# Patient Record
Sex: Female | Born: 1995 | Race: White | Hispanic: No | Marital: Single | Smoking: Never smoker
Health system: Southern US, Community
[De-identification: ages and names within clinical notes are randomized; demographics above are authoritative.]

## PROBLEM LIST (undated history)

## (undated) HISTORY — PX: WISDOM TOOTH EXTRACTION: SHX21

---

## 2017-01-06 ENCOUNTER — Ambulatory Visit (INDEPENDENT_AMBULATORY_CARE_PROVIDER_SITE_OTHER): Payer: Medicaid Other | Admitting: Sports Medicine

## 2017-01-06 VITALS — BP 102/62 | Ht 64.0 in | Wt 103.0 lb

## 2017-01-06 DIAGNOSIS — M25311 Other instability, right shoulder: Secondary | ICD-10-CM | POA: Diagnosis not present

## 2017-01-06 DIAGNOSIS — M25511 Pain in right shoulder: Secondary | ICD-10-CM | POA: Diagnosis not present

## 2017-01-06 NOTE — Progress Notes (Signed)
   Subjective:    Patient ID: Isabel Reed, female    DOB: Jul 15, 1995, 21 y.o.   MRN: [id number]  HPI chief complaint: Right shoulder pain  21 year old left-hand-dominant female comes in today complaining of left shoulder pain that began over 2 months ago. Pain began acutely after [employer]. She does not recall any specific trauma. She describes an achy discomfort diffuse around the shoulder which is present primarily with any type of shoulder motion, especially overhead motion. She also gets associated numbness and tingling down into the arm and hand. She has a history of shoulder instability. She was treated 3 years ago with physical therapy and had excellent results. 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 She notes that she has multiple instances of shoulder subluxations and dislocations. She is now to point to where her shoulder will dislocate even while sleeping. She feels like it is dislocating posteriorly. She does have some associated weakness. Recent x-rays done in July were unremarkable per her report. She did have an MRI done of her right shoulder 3 years ago but it was not an MR arthrogram. She has not had any prior surgeries on her shoulder. She denies neck pain. She takes intermittent doses of Advil which helps some.  Past medical history reviewed Medications reviewed Allergies reviewed    Review of Systems    as above Objective:   Physical Exam  Well-developed, well-nourished. No acute distress. Awake alert and oriented 3. Vital signs reviewed  Right shoulder: Patient has full forward flexion actively and passively. She has limited abduction to 90 both actively and passively. Good internal rotation. Good external rotation. No tenderness to palpation over the clavicle or over the acromioclavicular joint. Positive sulcus sign. 2+ translation of the humeral head with passive anterior to  posterior movement. Positive apprehension. Positive O'Brien's. Rotator cuff strength is 5/5.  Neurological exam: Negative Spurling's. Strength is somewhat limited by pain. Reflexes are brisk and equal at the biceps, triceps, and brachial radialis tendons bilaterally. No atrophy.      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 It is now to the point to where she is having multiple episodes of subluxation and dislocations with activities of daily living. She is likely at the point of needing a capsulorrhaphy. She may also have a labral tear. Therefore, for presurgical planning, we will get an MRI arthrogram. Phone follow-up with those results when available. In the meantime she will continue with her Motrin as needed. I want her to continue with subscapularis strengthening but avoid all overhead activities.

## 2017-01-21 ENCOUNTER — Ambulatory Visit
Admission: RE | Admit: 2017-01-21 | Discharge: 2017-01-21 | Disposition: A | Discharge status: Home / Self Care | Payer: Medicaid Other | Source: Ambulatory Visit | Attending: Sports Medicine | Admitting: Sports Medicine

## 2017-01-21 ENCOUNTER — Encounter: Payer: Self-pay | Admitting: Radiology

## 2017-01-21 DIAGNOSIS — M25511 Pain in right shoulder: Secondary | ICD-10-CM

## 2017-01-21 MED ORDER — IOPAMIDOL (ISOVUE-M 200) INJECTION 41%
15.0000 mL | Freq: Once | INTRAMUSCULAR | Status: AC
  Administered 2017-01-21: 15 mL via INTRA_ARTICULAR

## 2017-01-25 ENCOUNTER — Telehealth: Payer: Self-pay | Admitting: Sports Medicine

## 2017-01-25 NOTE — Telephone Encounter (Signed)
I spoke with the patient on the phone today after reviewing her right shoulder MRI arthrogram. There is no evidence of labral tear or rotator cuff tear. This is reassuring but I think her main issue is chronic instability which continues to affect her activities of daily living. I think it would be beneficial for her to meet with an orthopedist to discuss a possible capsulorrhaphy. She is in college at [place] and would like to discuss this with her mother. She may prefer to meet with an orthopedist closer to home. She will call me if she needs me to make a referral for her. Otherwise, follow-up as needed.

## 2017-01-27 ENCOUNTER — Other Ambulatory Visit: Payer: Self-pay

## 2017-01-27 DIAGNOSIS — M25311 Other instability, right shoulder: Secondary | ICD-10-CM

## 2017-08-31 ENCOUNTER — Emergency Department: Payer: Self-pay

## 2017-08-31 ENCOUNTER — Emergency Department
Admission: EM | Admit: 2017-08-31 | Discharge: 2017-08-31 | Disposition: A | Discharge status: Home / Self Care | Payer: Self-pay | Attending: Student in an Organized Health Care Education/Training Program | Admitting: Student in an Organized Health Care Education/Training Program

## 2017-08-31 DIAGNOSIS — S60222A Contusion of left hand, initial encounter: Secondary | ICD-10-CM | POA: Insufficient documentation

## 2017-08-31 DIAGNOSIS — Z79899 Other long term (current) drug therapy: Secondary | ICD-10-CM | POA: Insufficient documentation

## 2017-08-31 DIAGNOSIS — Y92002 Bathroom of unspecified non-institutional (private) residence single-family (private) house as the place of occurrence of the external cause: Secondary | ICD-10-CM | POA: Insufficient documentation

## 2017-08-31 DIAGNOSIS — Y999 Unspecified external cause status: Secondary | ICD-10-CM | POA: Insufficient documentation

## 2017-08-31 DIAGNOSIS — Y939 Activity, unspecified: Secondary | ICD-10-CM | POA: Insufficient documentation

## 2017-08-31 DIAGNOSIS — W2209XA Striking against other stationary object, initial encounter: Secondary | ICD-10-CM | POA: Insufficient documentation

## 2017-08-31 MED ORDER — MELOXICAM 7.5 MG PO TABS: 7.5000 mg | ORAL_TABLET | Freq: Every day | ORAL | 1 refills | Status: AC

## 2017-08-31 NOTE — ED Notes (Signed)
Pt discharged to home.  Friend driving.  Discharge instructions reviewed.  Verbalized understanding.  No questions or concerns at this time.  Teach back verified.  Pt in NAD.  No items left in ED.   

## 2017-08-31 NOTE — ED Provider Notes (Signed)
[facility] Emergency Department Provider Note  ____________________________________________  Time seen: Approximately 9:20 PM  I have reviewed the triage vital signs and the nursing notes.   HISTORY  Chief Complaint Hand Pain    HPI Isabel Reed is a 22 y.o. female presents to the emergency department with left hand pain after she struck a wall.  Patient has  most of her pain along the MCP joint of the left third digit.  Patient reports that she intentionally struck the wall of her shower.  She has experienced left hand avoidance.  She denies radiculopathy or changes in sensation of the hand.  No skin compromise. She currently rates her pain at 10/10 in intensity and describes it as aching.   History reviewed. No pertinent past medical history.  There are no active problems to display for this patient.   Past Surgical History:  Procedure Laterality Date  . WISDOM TOOTH EXTRACTION      Prior to Admission medications   Medication Sig Start Date End Date Taking? Authorizing Provider  amphetamine-dextroamphetamine (ADDERALL) 20 MG tablet Take 20 mg by mouth daily.    [provider]  meloxicam (MOBIC) 7.5 MG tablet Take 1 tablet (7.5 mg total) by mouth daily for 7 days. 08/31/17 09/07/17  Orvil Feil, PA-C    Allergies Penicillins  No family history on file.  Social History Social History   Tobacco Use  . Smoking status: Never Smoker  . Smokeless tobacco: Never Used  Substance Use Topics  . Alcohol use: Yes  . Drug use: Not on file     Review of Systems  Constitutional: No fever/chills Eyes: No visual changes. No discharge ENT: No upper respiratory complaints. Cardiovascular: no chest pain. Respiratory: no cough. No SOB. Gastrointestinal: No abdominal pain.  No nausea, no vomiting.  No diarrhea.  No constipation. Musculoskeletal: Patient has left hand pain.  Skin: Negative for rash, abrasions, lacerations,  ecchymosis. Neurological: Negative for headaches, focal weakness or numbness.  ____________________________________________   PHYSICAL EXAM:  VITAL SIGNS: ED Triage Vitals  Enc Vitals Group     BP 08/31/17 2036 (!) 137/97     Pulse Rate 08/31/17 2036 (!) 111     Resp 08/31/17 2036 18     Temp 08/31/17 2036 99.8 F (37.7 C)     Temp Source 08/31/17 2036 Oral     SpO2 08/31/17 2036 100 %     Weight 08/31/17 2038 111 lb (50.3 kg)     Height --      Head Circumference --      Peak Flow --      Pain Score 08/31/17 2038 8     Pain Loc --      Pain Edu? --      Excl. in GC? --      Constitutional: Alert and oriented. Well appearing and in no acute distress. Eyes: Conjunctivae are normal. PERRL. EOMI. Head: Atraumatic. Cardiovascular: Normal rate, regular rhythm. Normal S1 and S2.  Good peripheral circulation. Respiratory: Normal respiratory effort without tachypnea or retractions. Lungs CTAB. Good air entry to the bases with no decreased or absent breath sounds. Musculoskeletal: Patient is able to perform full range of motion at the left wrist.  She is able to move all 5 left fingers.  She has focal ecchymosis over the MCP joint of the left third digit.  Palpable radial pulse, left.  No flexor or extensor tendon deficits appreciated, left Neurologic:  Normal speech and language. No gross focal  neurologic deficits are appreciated.  Skin:  Skin is warm, dry and intact. No rash noted. Psychiatric: Mood and affect are normal. Speech and behavior are normal. Patient exhibits appropriate insight and judgement.   ____________________________________________   LABS (all labs ordered are listed, but only abnormal results are displayed)  Labs Reviewed - No data to display ____________________________________________  EKG   ____________________________________________  RADIOLOGY Geraldo Pitter, personally viewed and evaluated these images (plain radiographs) as part of my  medical decision making, as well as reviewing the written report by the radiologist.  Dg Hand Complete Left  Result Date: 08/31/2017 CLINICAL DATA:  Hand pain after punching a wall. EXAM: LEFT HAND - COMPLETE 3+ VIEW COMPARISON:  None. FINDINGS: There is no evidence of fracture or dislocation. There is no evidence of arthropathy or other focal bone abnormality. Mild dorsal hand soft tissue swelling without subcutaneous gas or radiopaque foreign bodies. IMPRESSION: Soft tissue swelling without acute osseous process. Electronically Signed   By: Awilda Metro M.D.   On: 08/31/2017 21:14    ____________________________________________    PROCEDURES  Procedure(s) performed:    Procedures    Medications - No data to display   ____________________________________________   INITIAL IMPRESSION / ASSESSMENT AND PLAN / ED COURSE  Pertinent labs & imaging results that were available during my care of the patient were reviewed by me and considered in my medical decision making (see chart for details).  Review of the [place] CSRS was performed in accordance of the [facility] prior to dispensing any controlled drugs.    Assessment and Plan: Hand Contusion:  Patient presents to the emergency department with left third digit pain after striking a wall.  Differential diagnosis included fracture versus contusion.  No acute fractures were identified on x-ray examination of the hand.  Patient was discharged with meloxicam and advised to follow-up with Dr. Stephenie Acres as needed.  All patient questions were answered.    ____________________________________________  FINAL CLINICAL IMPRESSION(S) / ED DIAGNOSES  Final diagnoses:  Contusion of left hand, initial encounter      NEW MEDICATIONS STARTED DURING THIS VISIT:  ED Discharge Orders        Ordered    meloxicam (MOBIC) 7.5 MG tablet  Daily     08/31/17 2124          This chart was dictated using voice recognition software/Dragon. Despite  best efforts to proofread, errors can occur which can change the meaning. Any change was purely unintentional.    Gasper Lloyd 08/31/17 2128    Willy Eddy, MD 08/31/17 [phone]

## 2017-08-31 NOTE — ED Triage Notes (Signed)
Patient punch a wall with a left hand. Patient c/o left hand pain

## 2018-05-07 ENCOUNTER — Other Ambulatory Visit: Payer: Self-pay

## 2018-05-07 ENCOUNTER — Emergency Department
Admission: EM | Admit: 2018-05-07 | Discharge: 2018-05-07 | Disposition: A | Discharge status: Home / Self Care | Payer: Medicaid Other | Attending: Emergency Medicine | Admitting: Emergency Medicine

## 2018-05-07 DIAGNOSIS — F10920 Alcohol use, unspecified with intoxication, uncomplicated: Secondary | ICD-10-CM

## 2018-05-07 DIAGNOSIS — Z79899 Other long term (current) drug therapy: Secondary | ICD-10-CM | POA: Insufficient documentation

## 2018-05-07 DIAGNOSIS — F10929 Alcohol use, unspecified with intoxication, unspecified: Secondary | ICD-10-CM | POA: Diagnosis present

## 2018-05-07 LAB — I-STAT BETA HCG BLOOD, ED (NOT ORDERABLE): I-stat hCG, quantitative: <5 m[IU]/mL (ref <5)

## 2018-05-07 MED ORDER — ONDANSETRON 4 MG PO TBDP
4.0000 mg | ORAL_TABLET | Freq: Once | ORAL | Status: AC
  Administered 2018-05-07: 4 mg via ORAL
  Filled 2018-05-07: qty 1

## 2018-05-07 NOTE — ED Notes (Signed)
ISTAT beta hcg preformed. Result was negative. MD Derrill Kay aware.

## 2018-05-07 NOTE — ED Notes (Signed)
Per MD no labs at this time, pregnancy test only.

## 2018-05-07 NOTE — ED Notes (Signed)
Gave pt cup of water with permission from MD

## 2018-05-07 NOTE — ED Provider Notes (Signed)
[facility] Emergency Department Provider Note   ____________________________________________   I have reviewed the triage vital signs and the nursing notes.   HISTORY  Chief Complaint Alcohol Intoxication   History limited by and level 5 caveat due to: intoxication   HPI Isabel Reed is a 23 y.o. female who presents to the emergency department today because of concern for alcohol intoxication. Friends state that she was drinking tequila and alcohol today. The patient herself was able to deny that she used any other recreational drugs. States she took her normal medications this morning. Denies any falls today. Denies any headache. States that she does feel nauseous.   Per medical record review patient has a history of wisdom tooth extraction.  History reviewed. No pertinent past medical history.  There are no active problems to display for this patient.   Past Surgical History:  Procedure Laterality Date  . WISDOM TOOTH EXTRACTION      Prior to Admission medications   Medication Sig Start Date End Date Taking? Authorizing Provider  amphetamine-dextroamphetamine (ADDERALL) 20 MG tablet Take 20 mg by mouth daily.    [provider]    Allergies Penicillins  History reviewed. No pertinent family history.  Social History Social History   Tobacco Use  . Smoking status: Never Smoker  . Smokeless tobacco: Never Used  Substance Use Topics  . Alcohol use: Yes  . Drug use: Not on file    Review of Systems Constitutional: No fever/chills Cardiovascular: Denies chest pain. Respiratory: Denies shortness of breath. Gastrointestinal: Positive for nausea.  Genitourinary: Negative for dysuria. Musculoskeletal: Negative for back pain. Skin: Negative for rash. Neurological: Negative for headaches, focal weakness or numbness.  ____________________________________________   PHYSICAL EXAM:  VITAL SIGNS: ED Triage Vitals  Enc Vitals  Group     BP 05/07/18 1826 113/79     Pulse Rate 05/07/18 1826 77     Resp 05/07/18 1826 10     Temp 05/07/18 1826 (!) 97.4 F (36.3 C)     Temp Source 05/07/18 1826 Oral     SpO2 05/07/18 1826 98 %     Weight 05/07/18 1824 105 lb 6.1 oz (47.8 kg)     Height --      Head Circumference --      Peak Flow --      Pain Score 05/07/18 1824 0   Constitutional: Appears intoxicated. Somnolent but will wake up and answer questions.  Eyes: Conjunctivae are normal.  ENT      Head: Normocephalic and atraumatic.      Nose: No congestion/rhinnorhea.      Mouth/Throat: Mucous membranes are moist.      Neck: No stridor. Hematological/Lymphatic/Immunilogical: No cervical lymphadenopathy. Cardiovascular: Normal rate, regular rhythm.  No murmurs, rubs, or gallops.  Respiratory: Normal respiratory effort without tachypnea nor retractions. Breath sounds are clear and equal bilaterally. No wheezes/rales/rhonchi. Gastrointestinal: Soft and non tender. No rebound. No guarding.  Genitourinary: Deferred Musculoskeletal: Normal range of motion in all extremities. No lower extremity edema. Neurologic:  Intoxicated.  Skin:  Skin is warm, dry and intact. No rash noted.  ____________________________________________    LABS (pertinent positives/negatives)  Beta hcg <5  ____________________________________________   EKG  None  ____________________________________________    RADIOLOGY  None  ____________________________________________   PROCEDURES  Procedures  ____________________________________________   INITIAL IMPRESSION / ASSESSMENT AND PLAN / ED COURSE  Pertinent labs & imaging results that were available during my care of the patient were reviewed by me  and considered in my medical decision making (see chart for details).   Patient presented to the emergency department today because of concern for alcohol intoxication. Patient was able to state that she had been drinking  alcohol, denies any other drug use. Denies any falls or trauma today. Patient was observed in the emergency department and did clinically improve. Did discuss appropriate alcohol consumption with patient.   ____________________________________________   FINAL CLINICAL IMPRESSION(S) / ED DIAGNOSES  Final diagnoses:  Alcoholic intoxication without complication (HCC)     Note: This dictation was prepared with Dragon dictation. Any transcriptional errors that result from this process are unintentional     Phineas Semen, MD 05/08/18 [phone]

## 2018-05-07 NOTE — ED Notes (Signed)
Pt verbalized understanding of discharge instructions. NAD at this time. 

## 2018-05-07 NOTE — ED Notes (Addendum)
Pt has had no additional emesis occurrences. Pt states she is ok to go home and friends and present to take the patient home.

## 2018-05-07 NOTE — ED Notes (Signed)
Pt unable to tolerate liquids after med admin.

## 2018-05-07 NOTE — ED Triage Notes (Addendum)
Pt arrives to ED extremely intoxicated. Friends who drove her here state she hasn't eaten since 10am this morning. Was drinking champagne and tequila. Pt was vomiting PTA. Awake but unable to hold self up. Had to be picked up from car by staff members. Slurred speech noted. Crying.

## 2019-02-18 IMAGING — MR MR SHOULDER*R* W/CM
6 series · 40 of 40 positions shown · IV contrast (agent unspecified)
Comparison: Injection images dated 01/21/2017

CLINICAL DATA: Right shoulder pain. History of previous
dislocations.

EXAM:
MR ARTHROGRAM OF THE RIGHT SHOULDER
TECHNIQUE: Multiplanar, multisequence MR imaging of the right shoulder was
performed following the administration of intra-articular contrast.
CONTRAST:  See Injection Documentation.

[Series 3: T1 fat-sat · axial · 4.0mm · 0.25mm/px · z∈[-37,+53]mm · 6 of 20 slices shown (1 of 4)]
[im 1/20]
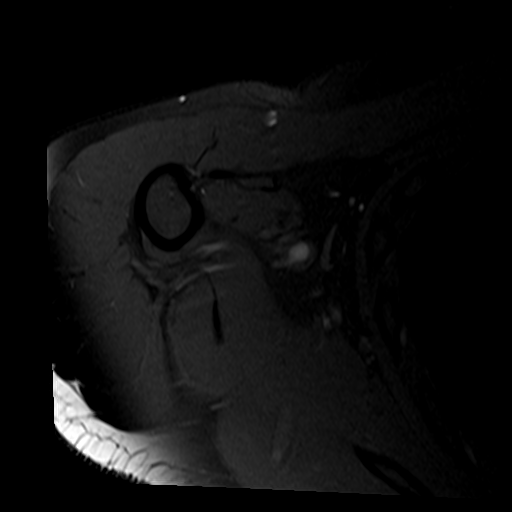
[im 4/20]
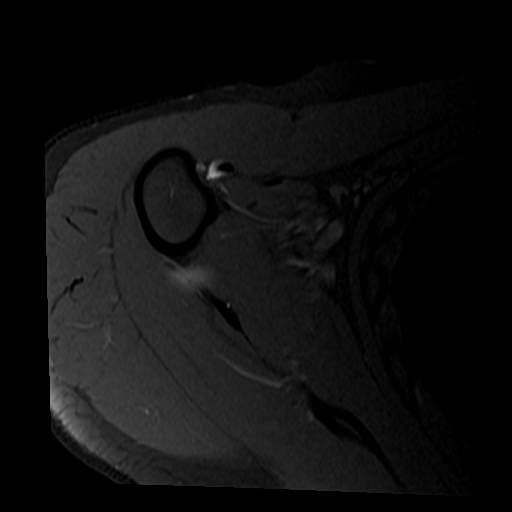
[im 8/20]
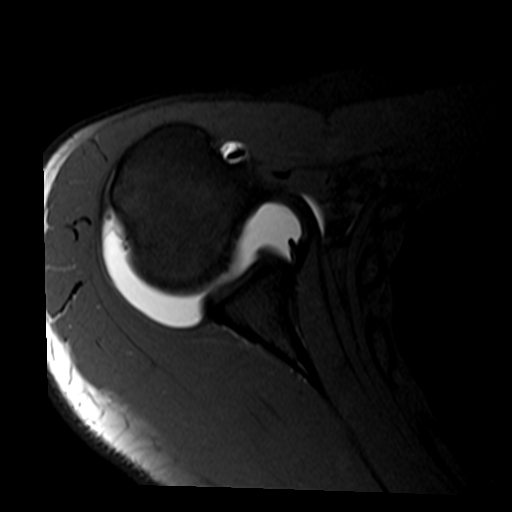
[im 12/20]
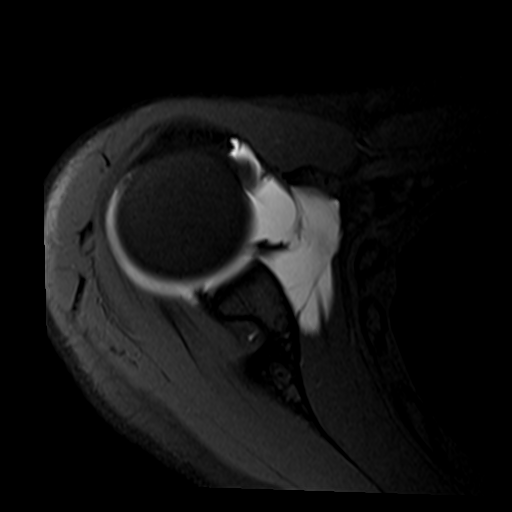
[im 16/20]
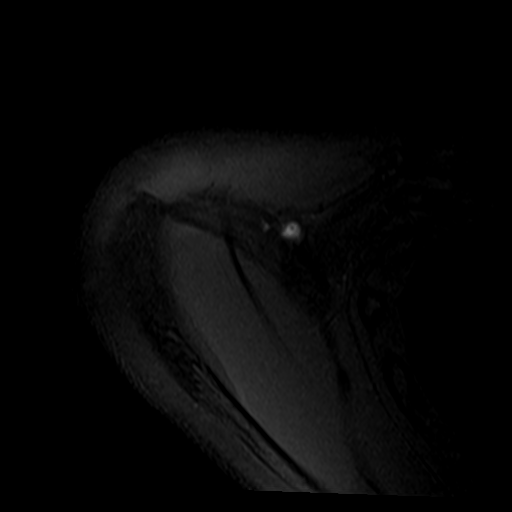
[im 20/20]
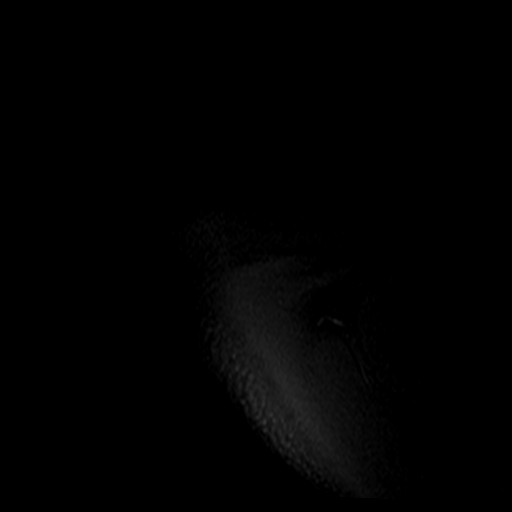

[Series 4: T2 fat-sat · oblique · 4.0mm · 0.55mm/px · 7 of 18 slices shown (1 of 2)]
[im 1/18]
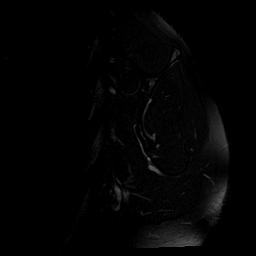
[im 3/18]
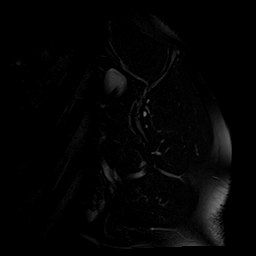
[im 6/18]
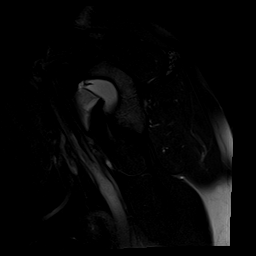
[im 9/18]
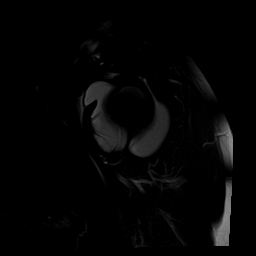
[im 12/18]
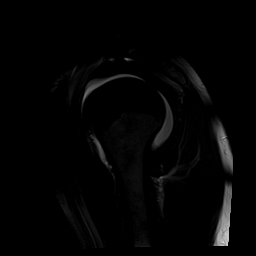
[im 15/18]
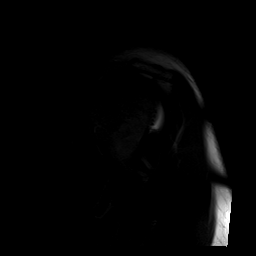
[im 18/18]
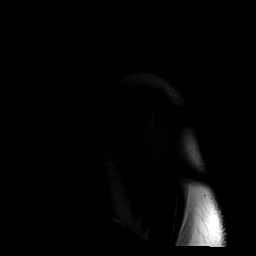

[Series 5: T1 fat-sat · oblique · 4.0mm · 0.44mm/px · 7 of 18 slices shown (2 of 4)]
[im 1/18]
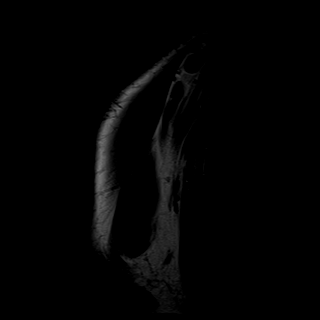
[im 3/18]
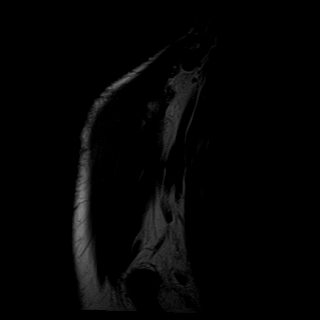
[im 6/18]
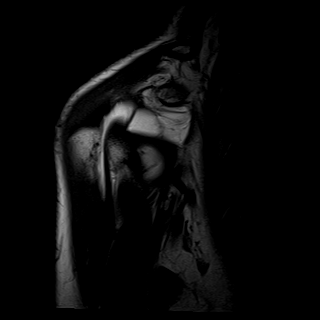
[im 9/18]
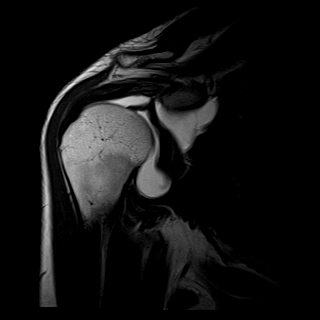
[im 12/18]
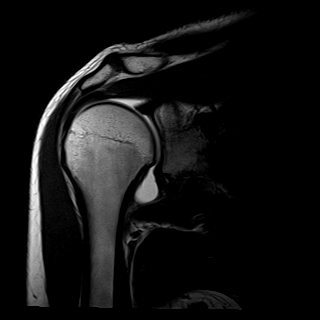
[im 15/18]
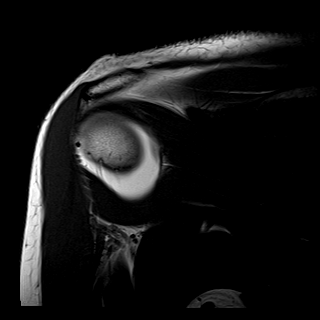
[im 18/18]
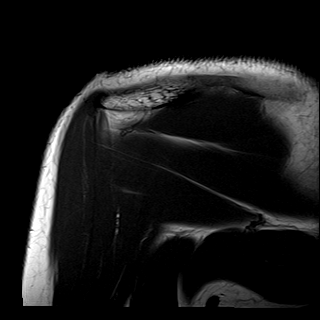

[Series 6: T1 fat-sat · oblique · 4.0mm · 0.55mm/px · 7 of 18 slices shown (3 of 4)]
[im 1/18]
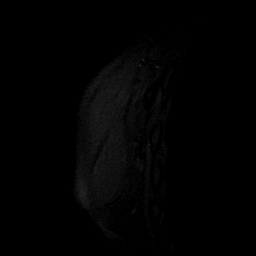
[im 3/18]
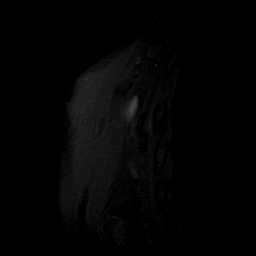
[im 6/18]
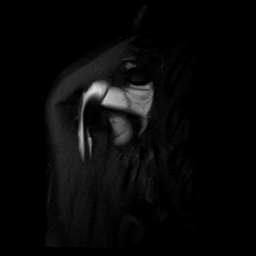
[im 9/18]
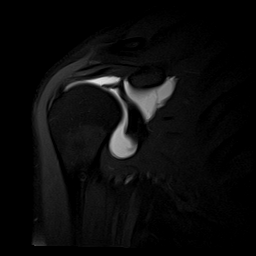
[im 12/18]
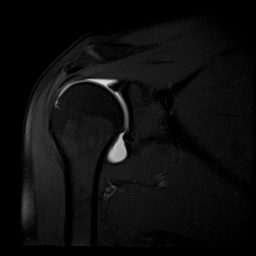
[im 15/18]
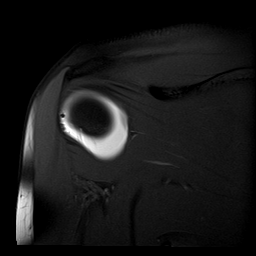
[im 18/18]
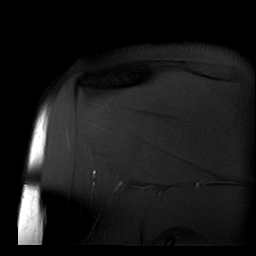

[Series 7: T2 fat-sat · oblique · 4.0mm · 0.55mm/px · 7 of 18 slices shown (2 of 2)]
[im 1/18]
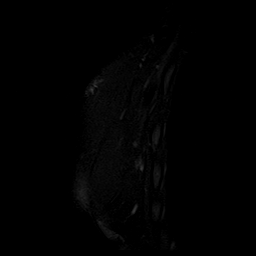
[im 3/18]
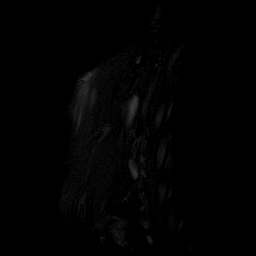
[im 6/18]
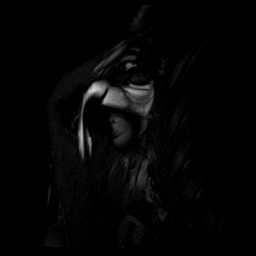
[im 9/18]
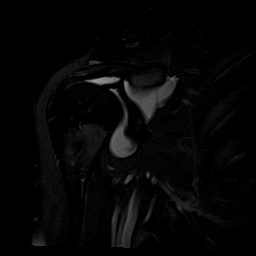
[im 12/18]
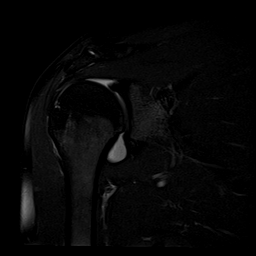
[im 15/18]
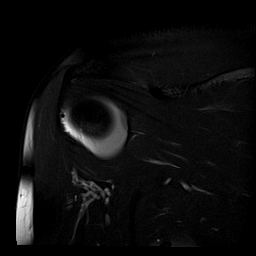
[im 18/18]
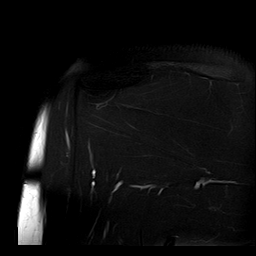

[Series 10: T1 fat-sat · sagittal · 4.0mm · 0.59mm/px · 6 of 16 slices shown (4 of 4)]
[im 1/16]
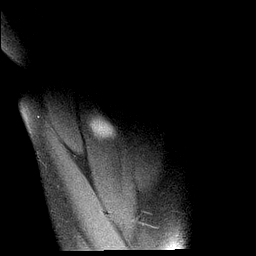
[im 4/16]
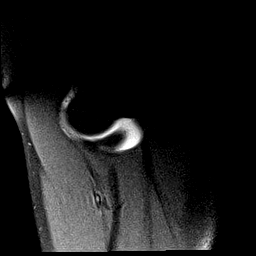
[im 7/16]
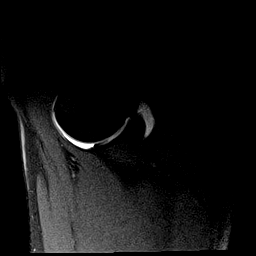
[im 10/16]
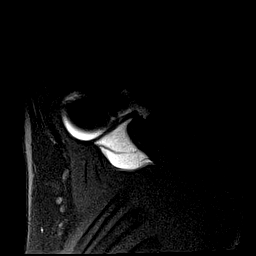
[im 13/16]
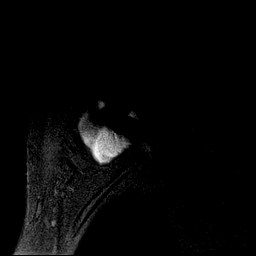
[im 16/16]
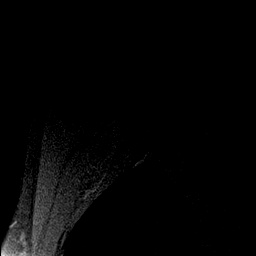

[40 of 40 positions shown; findings below may reference images not displayed]

FINDINGS: Rotator cuff: Normal.

Muscles: Normal.  No atrophy or edema.

Biceps long head: Properly located and intact.

Acromioclavicular Joint: Normal.  Type 1 acromion.  No bursitis.

Glenohumeral Joint: Normal.

Labrum: Normal.

Bones: Normal. No evidence of Hill-Sachs lesion or other bone
abnormality.
IMPRESSION: Normal MR arthrogram of the right shoulder. Specifically, no
findings suggestive of previous anterior dislocation. Normal
appearing labrum.

## 2019-09-28 IMAGING — CR DG HAND COMPLETE 3+V*L*
1 series · 3 of 3 positions shown · non-contrast
Comparison: None.

CLINICAL DATA: Hand pain after punching a wall.

EXAM:
LEFT HAND - COMPLETE 3+ VIEW

[Series 1: x hand pa left · 0.14mm/px · 3 of 3 slices shown]
[im 1/3]
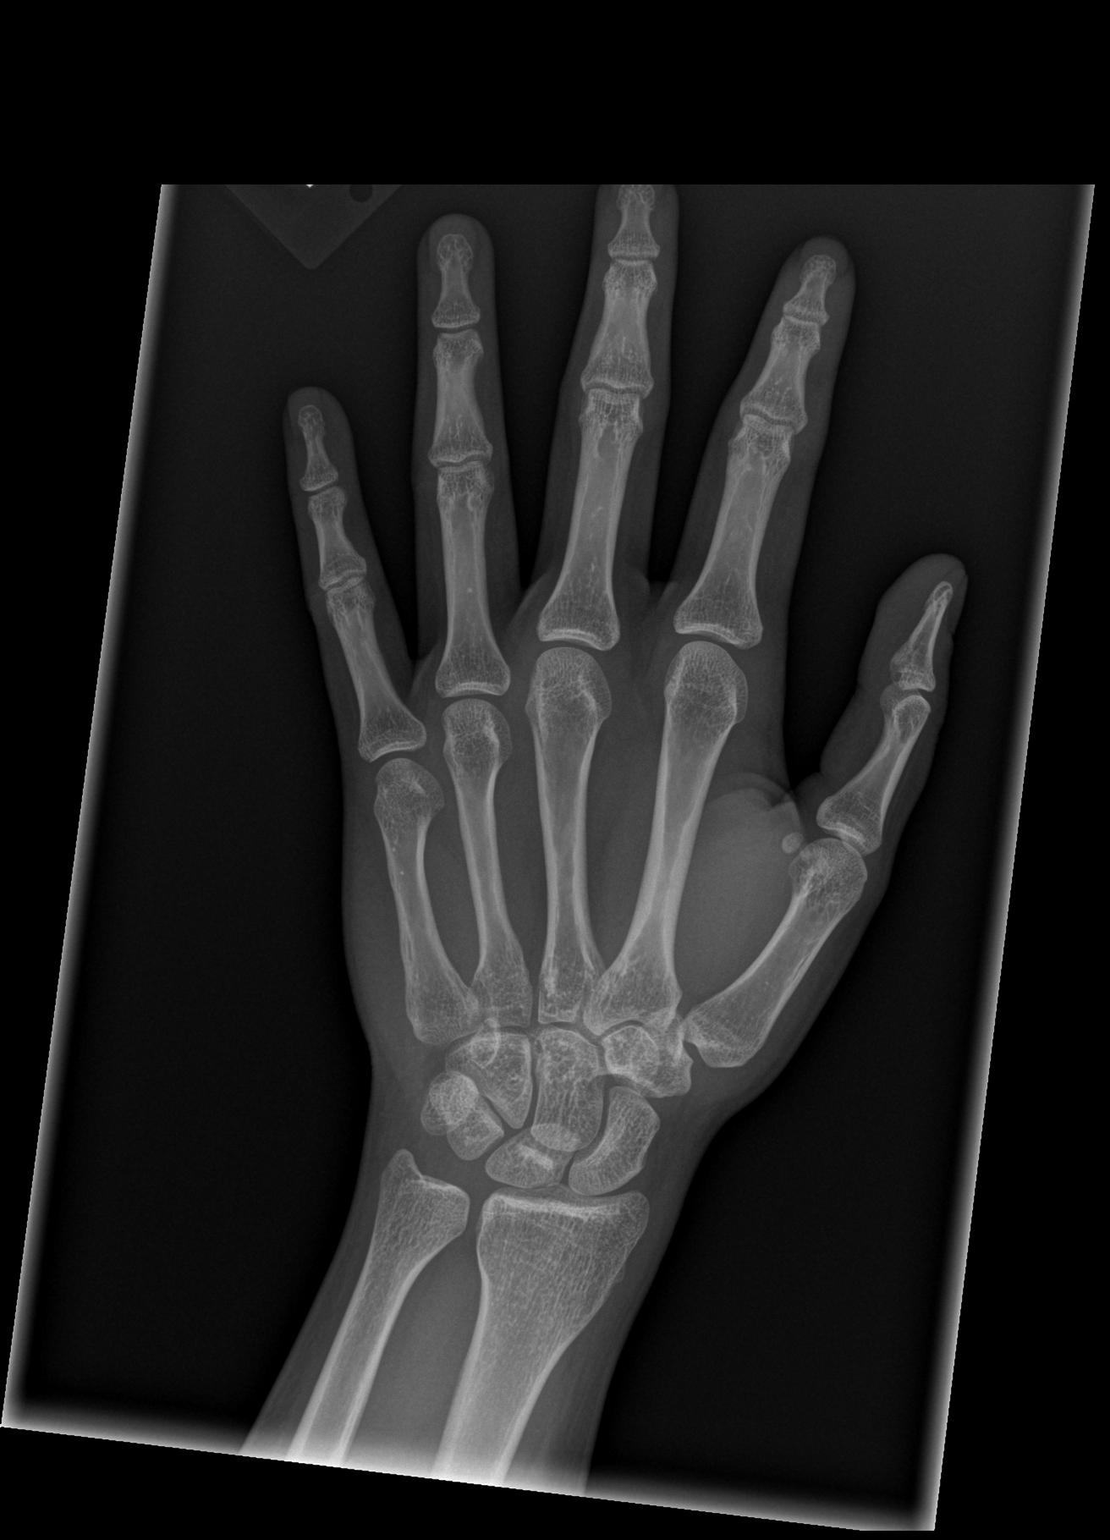
[im 2/3]
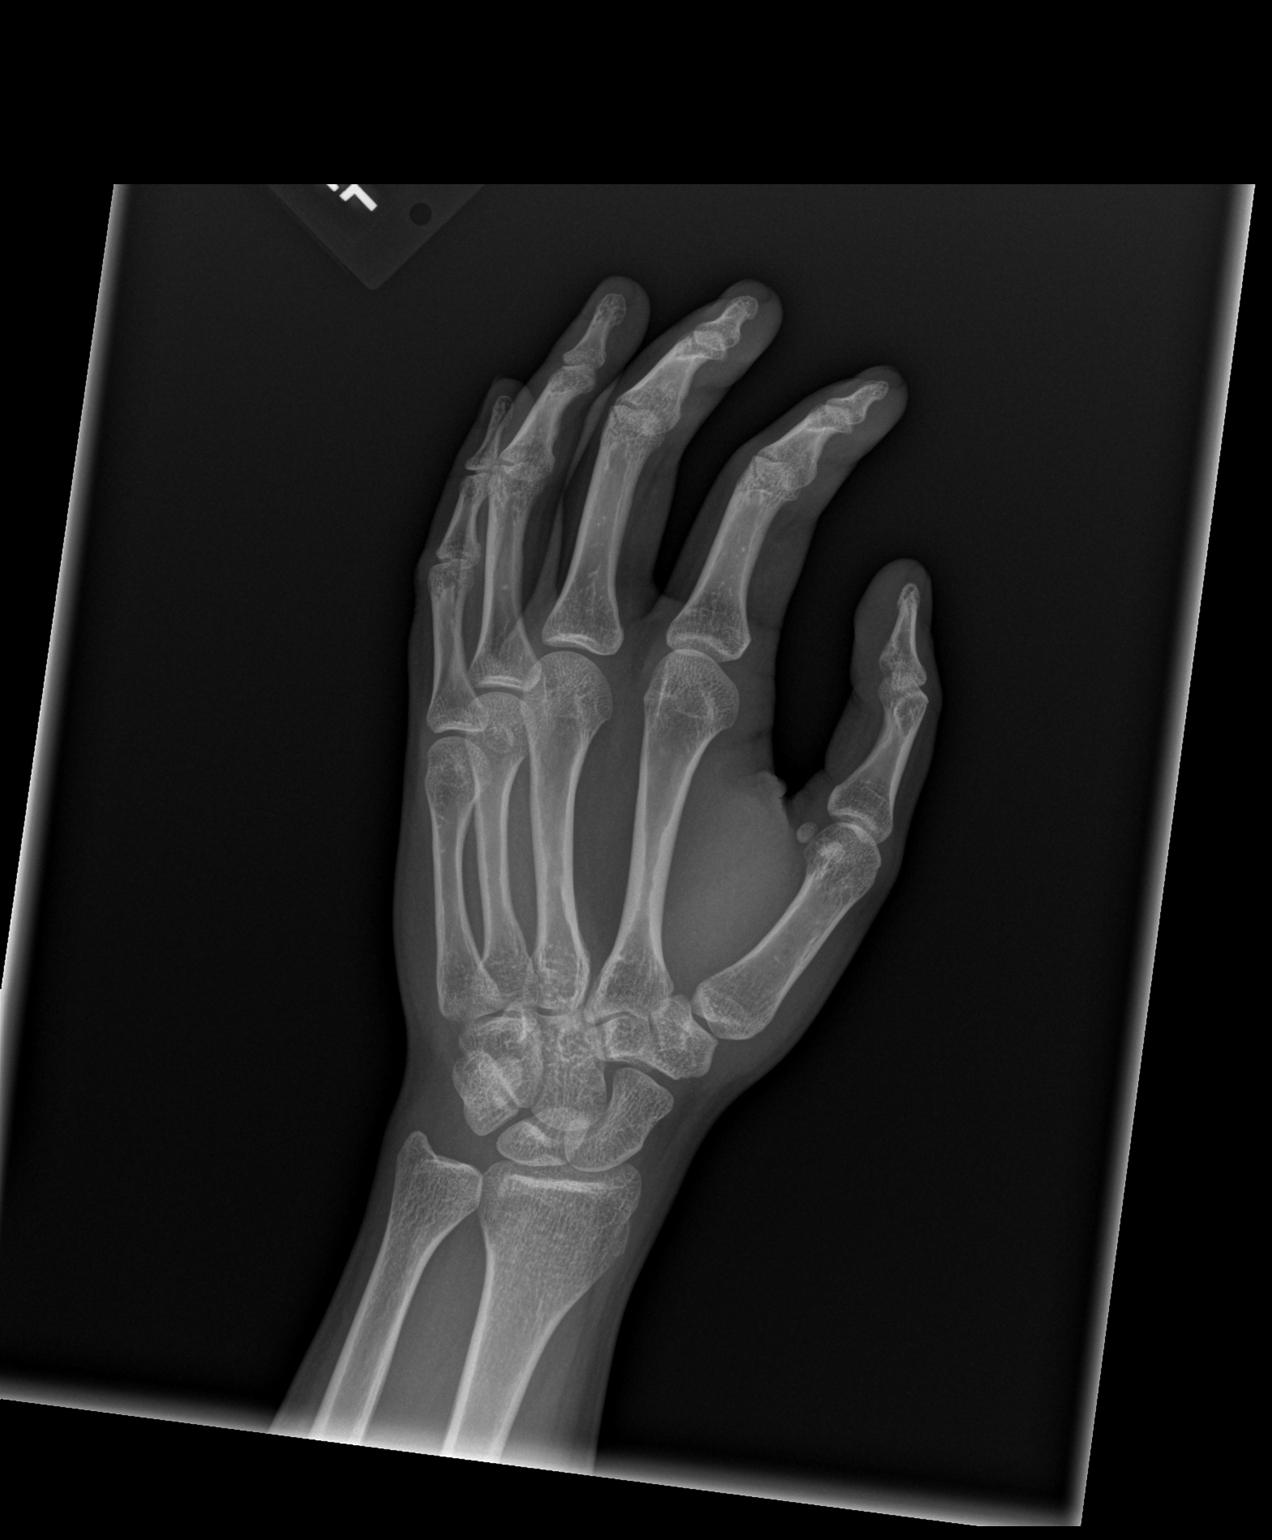
[im 3/3]
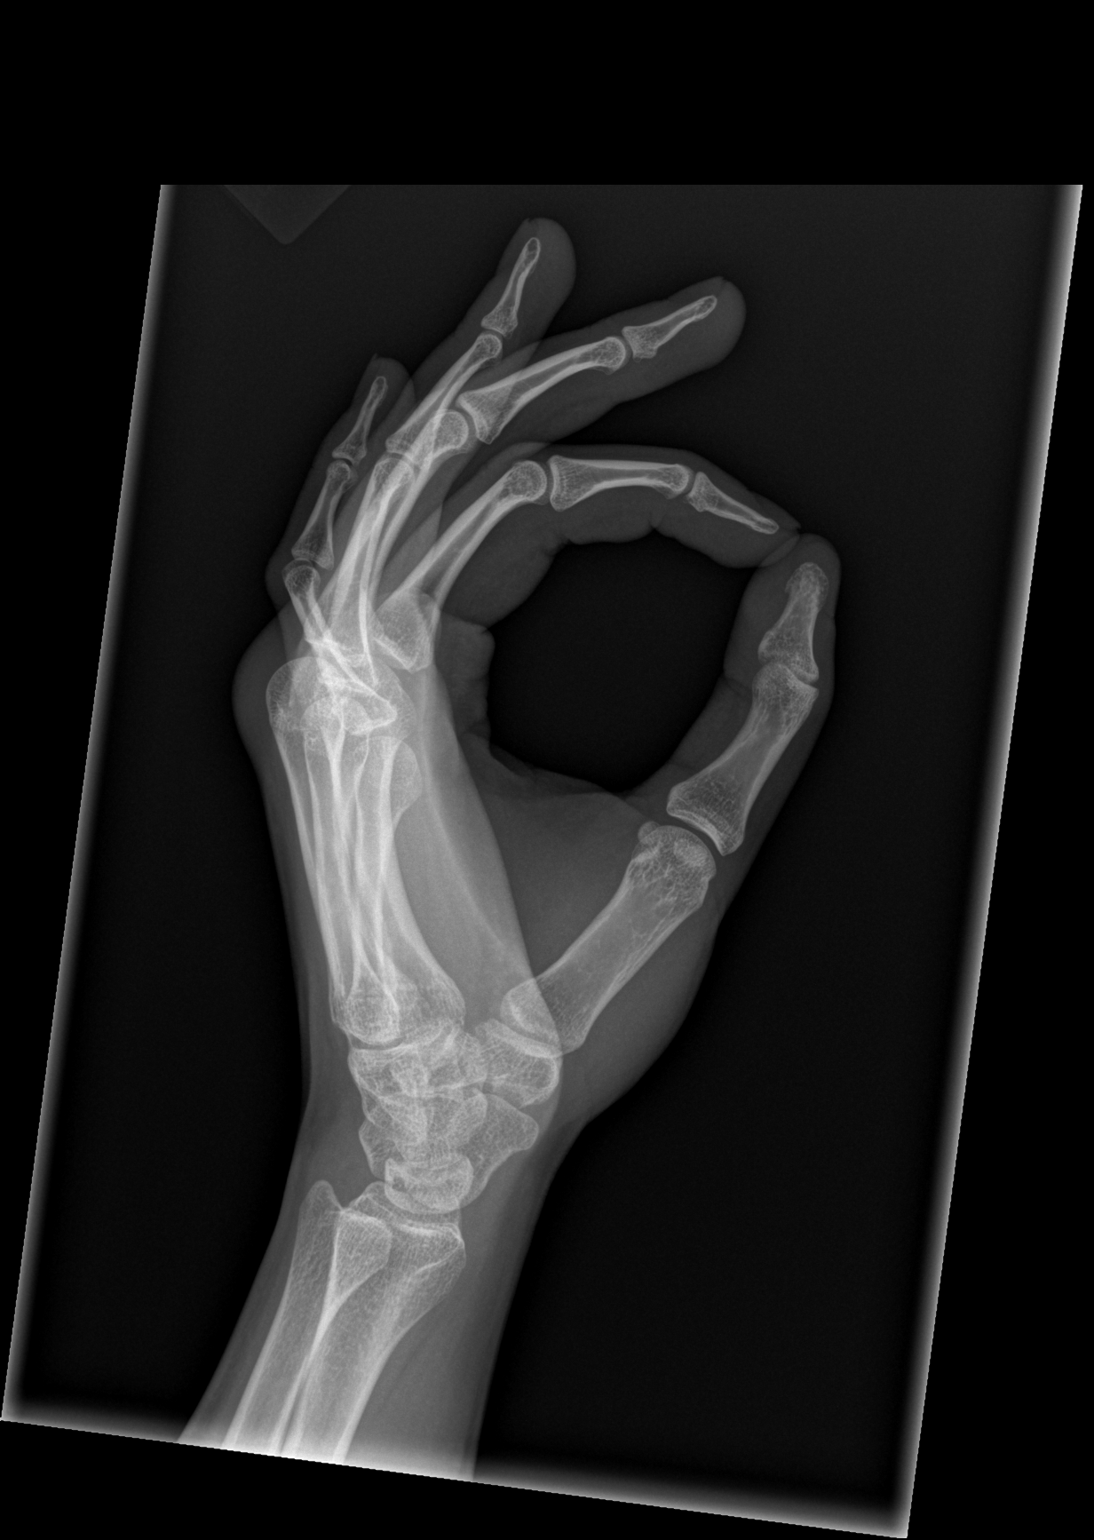

[3 of 3 positions shown; findings below may reference images not displayed]

FINDINGS: There is no evidence of fracture or dislocation. There is no
evidence of arthropathy or other focal bone abnormality. Mild dorsal
hand soft tissue swelling without subcutaneous gas or radiopaque
foreign bodies.
IMPRESSION: Soft tissue swelling without acute osseous process.
